# Patient Record
Sex: Male | Born: 1985 | Race: White | Hispanic: No | Marital: Single | Smoking: Current every day smoker
Health system: Southern US, Community
[De-identification: ages and names within clinical notes are randomized; demographics above are authoritative.]

## PROBLEM LIST (undated history)

## (undated) DIAGNOSIS — J45909 Unspecified asthma, uncomplicated: Secondary | ICD-10-CM

## (undated) DIAGNOSIS — M1990 Unspecified osteoarthritis, unspecified site: Secondary | ICD-10-CM

---

## 2008-07-09 ENCOUNTER — Ambulatory Visit: Payer: Self-pay | Admitting: Family Medicine

## 2009-11-02 ENCOUNTER — Emergency Department: Payer: Self-pay | Admitting: Emergency Medicine

## 2010-02-07 ENCOUNTER — Emergency Department: Payer: Self-pay | Admitting: Emergency Medicine

## 2010-10-22 ENCOUNTER — Emergency Department: Payer: Self-pay | Admitting: Unknown Physician Specialty

## 2011-01-14 ENCOUNTER — Emergency Department: Payer: Self-pay | Admitting: Emergency Medicine

## 2012-02-24 ENCOUNTER — Emergency Department: Payer: Self-pay | Admitting: Emergency Medicine

## 2012-02-24 LAB — CBC WITH DIFFERENTIAL/PLATELET
Basophil #: 0 10*3/uL (ref 0.0–0.1)
Basophil %: 0.5 %
Eosinophil #: 0.2 10*3/uL (ref 0.0–0.7)
Lymphocyte #: 1.5 10*3/uL (ref 1.0–3.6)
Lymphocyte %: 20.7 %
MCH: 32.4 pg (ref 26.0–34.0)
MCHC: 33.9 g/dL (ref 32.0–36.0)
Monocyte #: 0.8 10*3/uL — ABNORMAL HIGH (ref 0.0–0.7)
Neutrophil %: 65.9 %
Platelet: 117 10*3/uL — ABNORMAL LOW (ref 150–440)
RDW: 13.6 % (ref 11.5–14.5)

## 2012-02-24 LAB — URINALYSIS, COMPLETE
Bacteria: NONE SEEN
Blood: NEGATIVE
Glucose,UR: NEGATIVE mg/dL (ref 0–75)
Leukocyte Esterase: NEGATIVE
Nitrite: NEGATIVE
Ph: 6 (ref 4.5–8.0)
RBC,UR: NONE SEEN /HPF (ref 0–5)

## 2012-02-24 LAB — COMPREHENSIVE METABOLIC PANEL
Alkaline Phosphatase: 72 U/L (ref 50–136)
Anion Gap: 11 (ref 7–16)
BUN: 5 mg/dL — ABNORMAL LOW (ref 7–18)
Bilirubin,Total: 0.3 mg/dL (ref 0.2–1.0)
Co2: 28 mmol/L (ref 21–32)
Creatinine: 0.73 mg/dL (ref 0.60–1.30)
EGFR (African American): >60
EGFR (Non-African Amer.): >60
Osmolality: 285 (ref 275–301)
SGOT(AST): 17 U/L (ref 15–37)
SGPT (ALT): 18 U/L
Sodium: 145 mmol/L (ref 136–145)
Total Protein: 7.1 g/dL (ref 6.4–8.2)

## 2013-06-19 IMAGING — CT CT ABD-PELV W/ CM
1 of 2 series · 15 of 32 positions shown, 19 images · non-contrast
Comparison: none

REASON FOR EXAM: (1) right groin pain evaluate for incarcerated hernia;
(2) right groin pain eval
COMMENTS:   May transport without cardiac monitor

[Series 2: appendicitis · axial · 0.64mm/px · z∈[-608,-185]mm · 15 of 155 slices shown, 19 images]
[im 7/155  soft-tissue]
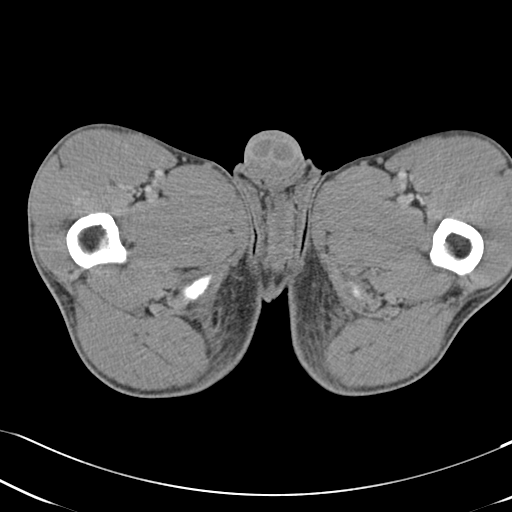
[im 7/155  bone]
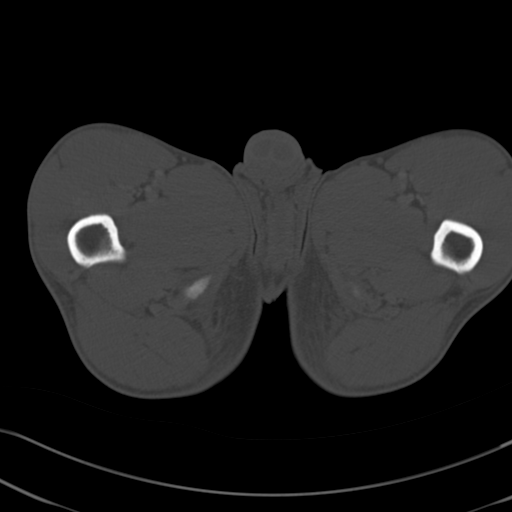
[im 19/155  soft-tissue]
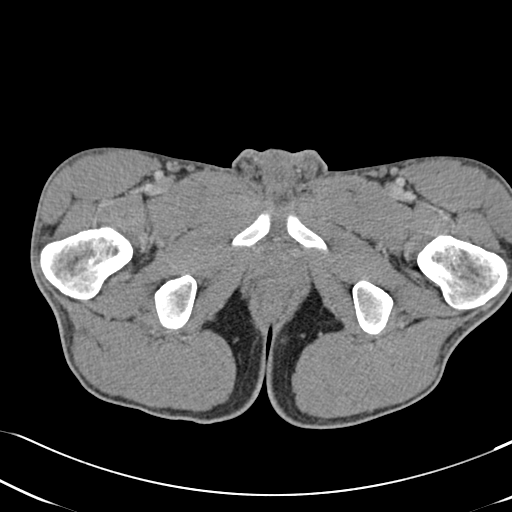
[im 31/155  soft-tissue]
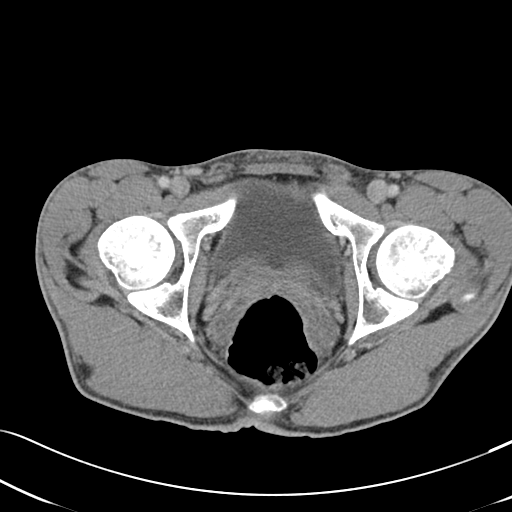
[im 44/155  soft-tissue]
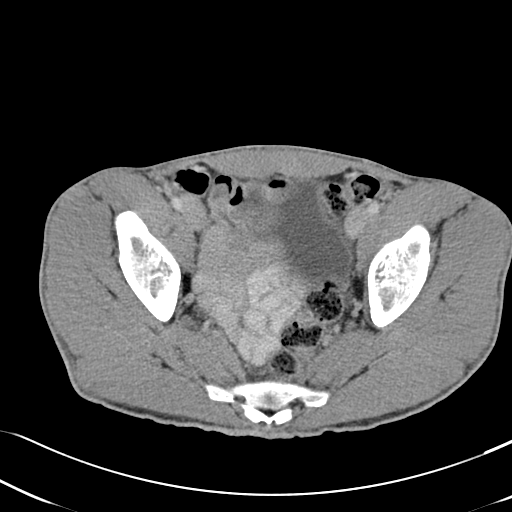
[im 56/155  soft-tissue]
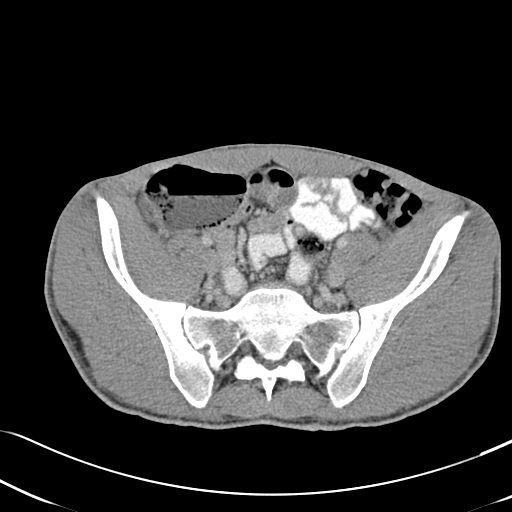
[im 68/155  soft-tissue]
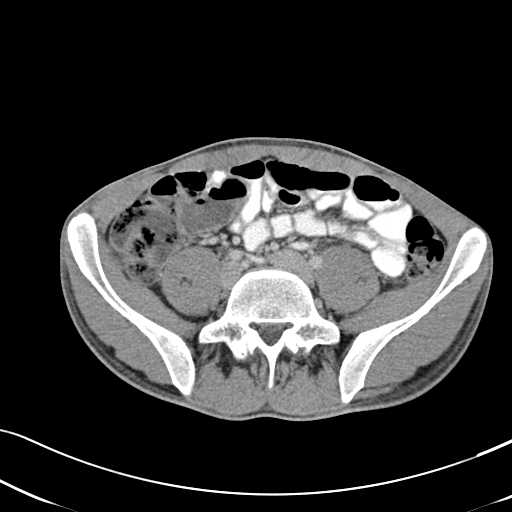
[im 81/155  soft-tissue]
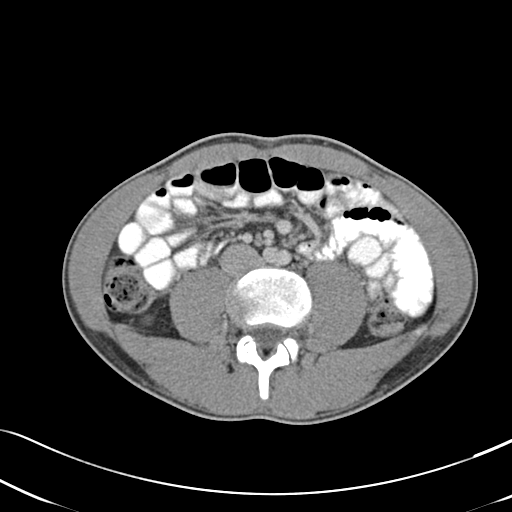
[im 87/155  soft-tissue]
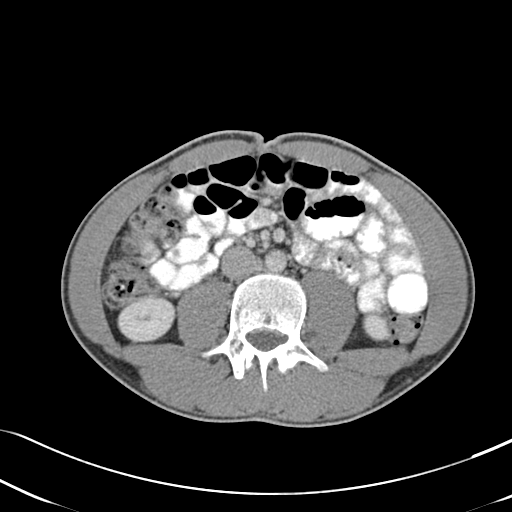
[im 99/155  soft-tissue]
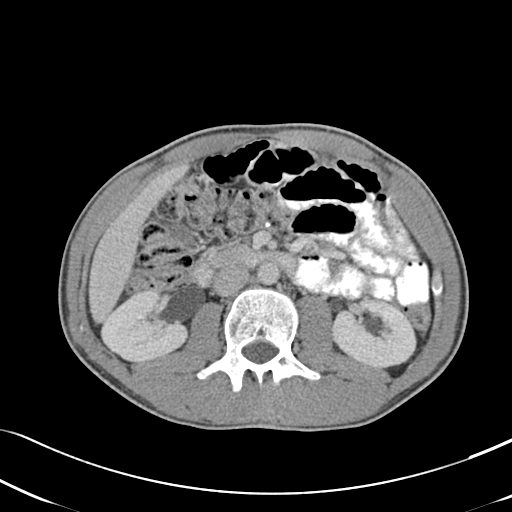
[im 99/155  bone]
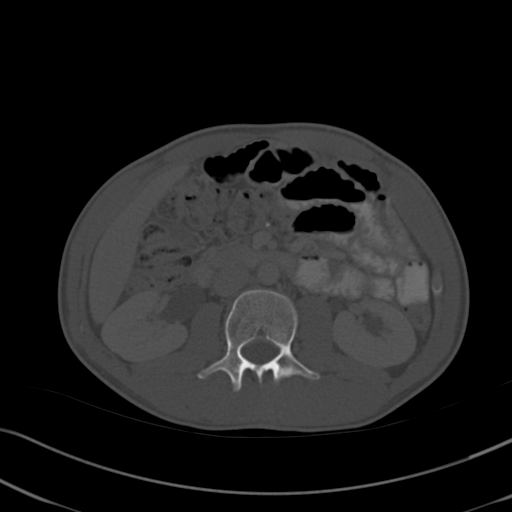
[im 111/155  soft-tissue]
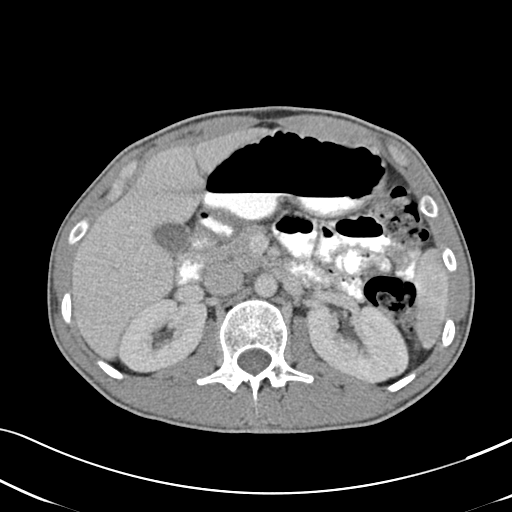
[im 124/155  soft-tissue]
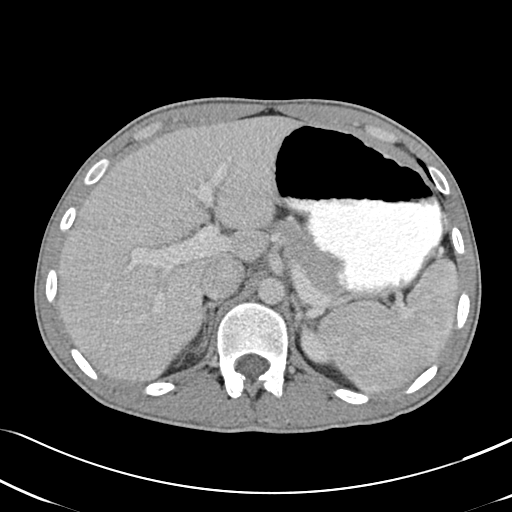
[im 130/155  lung]
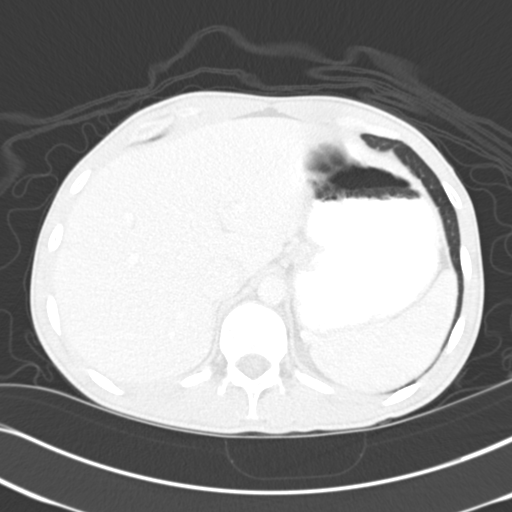
[im 136/155  soft-tissue]
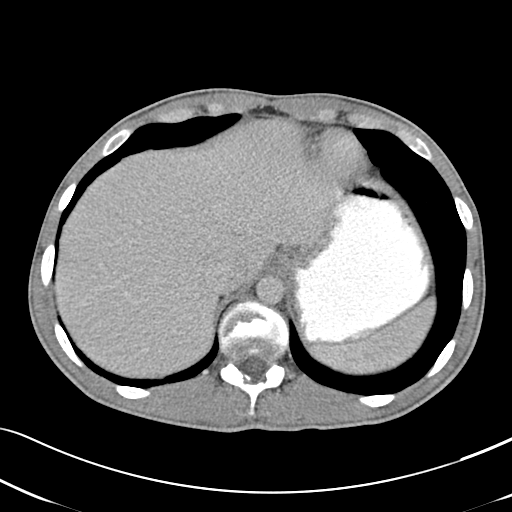
[im 136/155  lung]
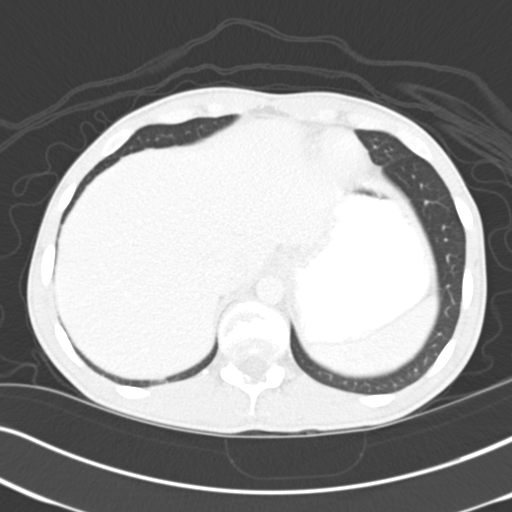
[im 142/155  lung]
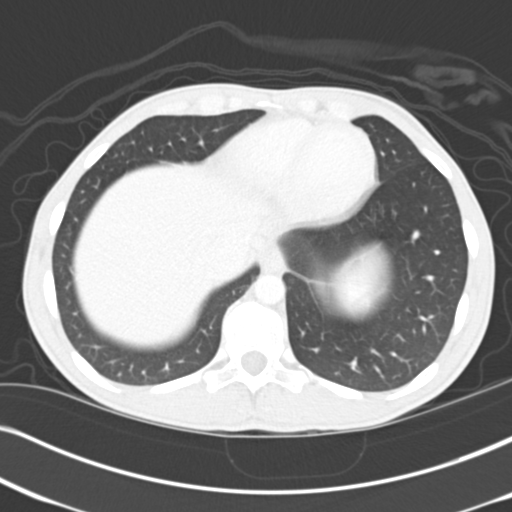
[im 148/155  soft-tissue]
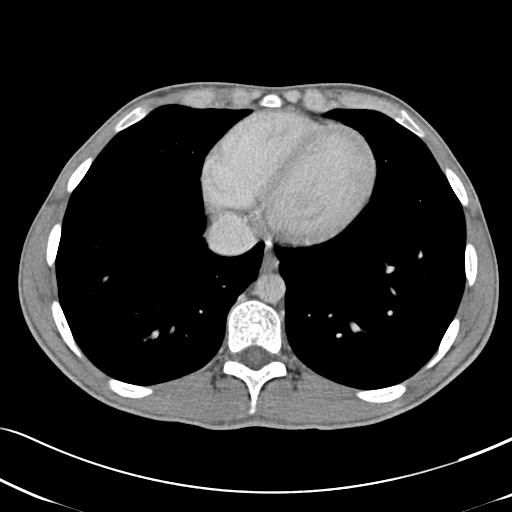
[im 148/155  lung]
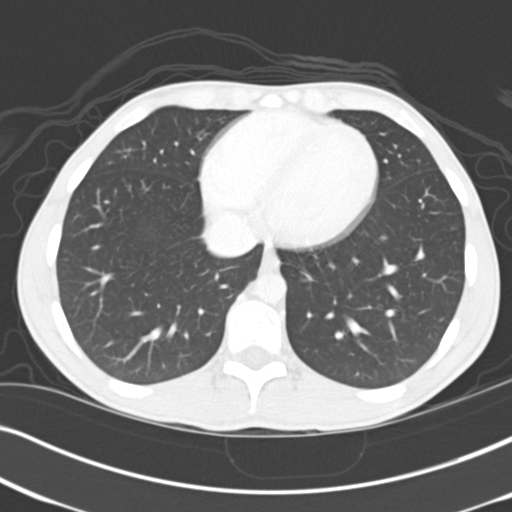

[15 of 32 positions shown; findings below may reference images not displayed]

PROCEDURE:     CT  - CT ABDOMEN / PELVIS  W  - February 24, 2012  [DATE]

RESULT:     CT of the abdomen and pelvis is performed with 100 mL of
Xsovue-RJJ iodinated intravenous contrast with images reconstructed at
mm slice thickness in the axial plane. oral contrast was also utilized. The
patient has no previous exam for comparison.

The contrast column has not reached the cecum and terminal ileum at the time
of imaging area there is a normal appearing appendix noted in the right
lower quadrant. The urinary bladder is distended. No abnormal bowel
distention is appreciated. No definite hydronephrosis or hydroureter is
appreciated. There is no evidence of an inguinal hernia. The abdominal wall
appears intact. The liver, gallbladder, pancreas, spleen, kidneys and
adrenal glands appear to be grossly normal. No gallstones are evident. The
aorta is normal in caliber. The bony structures are unremarkable.
IMPRESSION: 1. No evidence of an incarcerated inguinal hernia or other abdominal wall
defect. Normal-appearing appendix. No acute inflammatory process.

## 2014-12-15 ENCOUNTER — Emergency Department: Payer: Self-pay | Admitting: Emergency Medicine

## 2016-07-14 ENCOUNTER — Emergency Department: Payer: Self-pay

## 2016-07-14 ENCOUNTER — Emergency Department
Admission: EM | Admit: 2016-07-14 | Discharge: 2016-07-14 | Disposition: A | Discharge status: Home / Self Care | Payer: Self-pay | Attending: Emergency Medicine | Admitting: Emergency Medicine

## 2016-07-14 ENCOUNTER — Encounter: Payer: Self-pay | Admitting: *Deleted

## 2016-07-14 DIAGNOSIS — G8929 Other chronic pain: Secondary | ICD-10-CM | POA: Insufficient documentation

## 2016-07-14 DIAGNOSIS — R0789 Other chest pain: Secondary | ICD-10-CM

## 2016-07-14 DIAGNOSIS — F17210 Nicotine dependence, cigarettes, uncomplicated: Secondary | ICD-10-CM | POA: Insufficient documentation

## 2016-07-14 DIAGNOSIS — R072 Precordial pain: Secondary | ICD-10-CM | POA: Insufficient documentation

## 2016-07-14 DIAGNOSIS — J45909 Unspecified asthma, uncomplicated: Secondary | ICD-10-CM | POA: Insufficient documentation

## 2016-07-14 DIAGNOSIS — M25551 Pain in right hip: Secondary | ICD-10-CM | POA: Insufficient documentation

## 2016-07-14 DIAGNOSIS — R071 Chest pain on breathing: Secondary | ICD-10-CM

## 2016-07-14 HISTORY — DX: Unspecified osteoarthritis, unspecified site: M19.90

## 2016-07-14 HISTORY — DX: Unspecified asthma, uncomplicated: J45.909

## 2016-07-14 LAB — CBC
HEMATOCRIT: 46.4 % (ref 40.0–52.0)
Hemoglobin: 16 g/dL (ref 13.0–18.0)
MCH: 32.3 pg (ref 26.0–34.0)
MCHC: 34.5 g/dL (ref 32.0–36.0)
MCV: 93.7 fL (ref 80.0–100.0)
Platelets: 136 10*3/uL — ABNORMAL LOW (ref 150–440)
RBC: 4.96 MIL/uL (ref 4.40–5.90)
RDW: 13.2 % (ref 11.5–14.5)
WBC: 8.3 10*3/uL (ref 3.8–10.6)

## 2016-07-14 LAB — BASIC METABOLIC PANEL
Anion gap: 6 (ref 5–15)
BUN: 10 mg/dL (ref 6–20)
CHLORIDE: 103 mmol/L (ref 101–111)
CO2: 30 mmol/L (ref 22–32)
Calcium: 9.3 mg/dL (ref 8.9–10.3)
Creatinine, Ser: 0.97 mg/dL (ref 0.61–1.24)
GFR calc Af Amer: >60 mL/min (ref >60)
GFR calc non Af Amer: >60 mL/min (ref >60)
Glucose, Bld: 92 mg/dL (ref 65–99)
POTASSIUM: 4.1 mmol/L (ref 3.5–5.1)
SODIUM: 139 mmol/L (ref 135–145)

## 2016-07-14 LAB — TROPONIN I

## 2016-07-14 NOTE — ED Notes (Signed)
Pt presents w/ c/o R sided chest pain, reproducible w/ palpation and arm movement. Pt states pain started when he was cleaning a cutting board at home and pressing against the board with his hand caused his pain to increase. Pt in no respiratory distress at this time and rates his chest pain low at this time. 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 Pt does not have PCP secondary to lack of insurance. Pt denies accompanying sxs w/ chest pain. Pt c/o tenderness w/ palpation. Denies lung disease and has clear breath sounds bilaterally. Pt denies n/v at this time. Pt denies fever at this time.

## 2016-07-14 NOTE — ED Provider Notes (Addendum)
[facility] Emergency Department Provider Note   ____________________________________________  Time seen: Approximately 940 PM  I have reviewed the triage vital signs and the nursing notes.   HISTORY  Chief Complaint Chest Pain   HPI Andrew Zavala is a 30 y.o. male with a history of arthritis and asthma who is presenting to the emergency department with sudden onset chest pain several hours prior to arrival. He says that he was scrubbing dishes when he had a sharp sudden pain to the right side of his sternum anteriorly on his chest. He says it is sharp when he moves his arm but at rest it is dull. Says that arm movement as well as pushing on it hurts. Denies pain with deep breathing. Denies nausea vomiting or diaphoresis.He also says that his hip on the right side has been hurting especially with movement but says he has a history of arthritis or family history of early arthritis with hip replacements with multiple family now received needing hip replacement in the 30s.   Past Medical History  Diagnosis Date  . Arthritis   . Asthma     childhood    There are no active problems to display for this patient.   History reviewed. No pertinent past surgical history.  No current outpatient prescriptions on file.  Allergies Review of patient's allergies indicates no known allergies.  History reviewed. No pertinent family history.  Social History Social History  Substance Use Topics  . Smoking status: Current Every Day Smoker    Types: Cigarettes  . Smokeless tobacco: Never Used  . Alcohol Use: Yes    Review of Systems Constitutional: No fever/chills Eyes: No visual changes. ENT: No sore throat. Cardiovascular: As above Respiratory: Denies shortness of breath. Gastrointestinal: No abdominal pain.  No nausea, no vomiting.  No diarrhea.  No constipation. Genitourinary: Negative for dysuria. Musculoskeletal: Negative for back pain. Skin:  Negative for rash. Neurological: Negative for headaches, focal weakness or numbness.  10-point ROS otherwise negative.  ____________________________________________   PHYSICAL EXAM:  VITAL SIGNS: ED Triage Vitals  Enc Vitals Group     BP 07/14/16 1959 142/78 mmHg     Pulse Rate 07/14/16 1959 71     Resp 07/14/16 1959 18     Temp 07/14/16 1959 98.5 F (36.9 C)     Temp Source 07/14/16 1959 Oral     SpO2 07/14/16 1959 97 %     Weight 07/14/16 1959 175 lb (79.379 kg)     Height 07/14/16 1959 5\' 9"  (1.753 m)     Head Cir --      Peak Flow --      Pain Score 07/14/16 1959 2     Pain Loc --      Pain Edu? --      Excl. in GC? --     Constitutional: Alert and oriented. Well appearing and in no acute distress. Eyes: Conjunctivae are normal. PERRL. EOMI. Head: Atraumatic. Nose: No congestion/rhinnorhea. Mouth/Throat: Mucous membranes are moist.  Oropharynx non-erythematous. Neck: No stridor.   Cardiovascular: Normal rate, regular rhythm. Grossly normal heart sounds.  Good peripheral circulation With intact, equal bilateral pulses to the radial pulses and the dorsalis pedis pulses. Reproducible tenderness to palpation on the right sternal border likely where the ribs meet the sternum. Respiratory: Normal respiratory effort.  No retractions. Lungs CTAB. Gastrointestinal: Soft and nontender. No distention.  No CVA tenderness. Musculoskeletal: No lower extremity tenderness nor edema.  No joint effusions. 5 out of  5 strength bilateral lower extremities with hip flexion. Normal gait with walking. No deformity on external exam to the bilateral hips. Neurologic:  Normal speech and language. No gross focal neurologic deficits are appreciated. No gait instability. Skin:  Skin is warm, dry and intact. No rash noted. Psychiatric: Mood and affect are normal. Speech and behavior are normal.  ____________________________________________   LABS (all labs ordered are listed, but only abnormal  results are displayed)  Labs Reviewed  CBC - Abnormal; Notable for the following:    Platelets 136 (*)    All other components within normal limits  BASIC METABOLIC PANEL  TROPONIN I   ____________________________________________  EKG  ED ECG REPORT I, Schaevitz,  Teena Irani, the attending physician, personally viewed and interpreted this ECG.   Date: 07/14/2016  EKG Time: 1952  Rate: 69  Rhythm: normal sinus rhythm  Axis: Normal  Intervals:none  ST&T Change: No ST segment elevation or depression. No abnormal T-wave inversion.  ____________________________________________  RADIOLOGY      DG Chest 2 View (Final result) Result time: 07/14/16 21:43:30   Final result by Rad Results In Interface (07/14/16 21:43:30)   Narrative:   CLINICAL DATA: Chest pain  EXAM: CHEST 2 VIEW  COMPARISON: None.  FINDINGS: The heart size and mediastinal contours are within normal limits. Both lungs are clear. The visualized skeletal structures are unremarkable.  IMPRESSION: No active cardiopulmonary disease.   Electronically Signed By: Gerome Sam III M.D On: 07/14/2016 21:43    ____________________________________________   PROCEDURES  Procedures   ____________________________________________   INITIAL IMPRESSION / ASSESSMENT AND PLAN / ED COURSE  Pertinent labs & imaging results that were available during my care of the patient were reviewed by me and considered in my medical decision making (see chart for details).  Exam is consistent with costochondritis. Very reassuring workup otherwise. We'll give contact information for [facility] orthopedics. We'll also give follow-up with the [facility] clinic. PERC negative.  ____________________________________________   FINAL CLINICAL IMPRESSION(S) / ED DIAGNOSES  Costochondritis. Chronic right hip pain.    NEW MEDICATIONS STARTED DURING THIS VISIT:  New Prescriptions   No medications on file     Note:  This  document was prepared using Dragon voice recognition software and may include unintentional dictation errors.    Myrna Blazer, MD 07/14/16 2159  Discussed further treatment with the patient including ibuprofen, muscle cream such as BenGay as well as ice. He is understanding of the plan and willing to comply.  Myrna Blazer, MD 07/14/16 2202

## 2017-11-07 IMAGING — CR DG CHEST 2V
2 series · 2 of 2 positions shown · non-contrast
Comparison: None.

CLINICAL DATA: Chest pain

EXAM:
CHEST  2 VIEW

[chest pa]
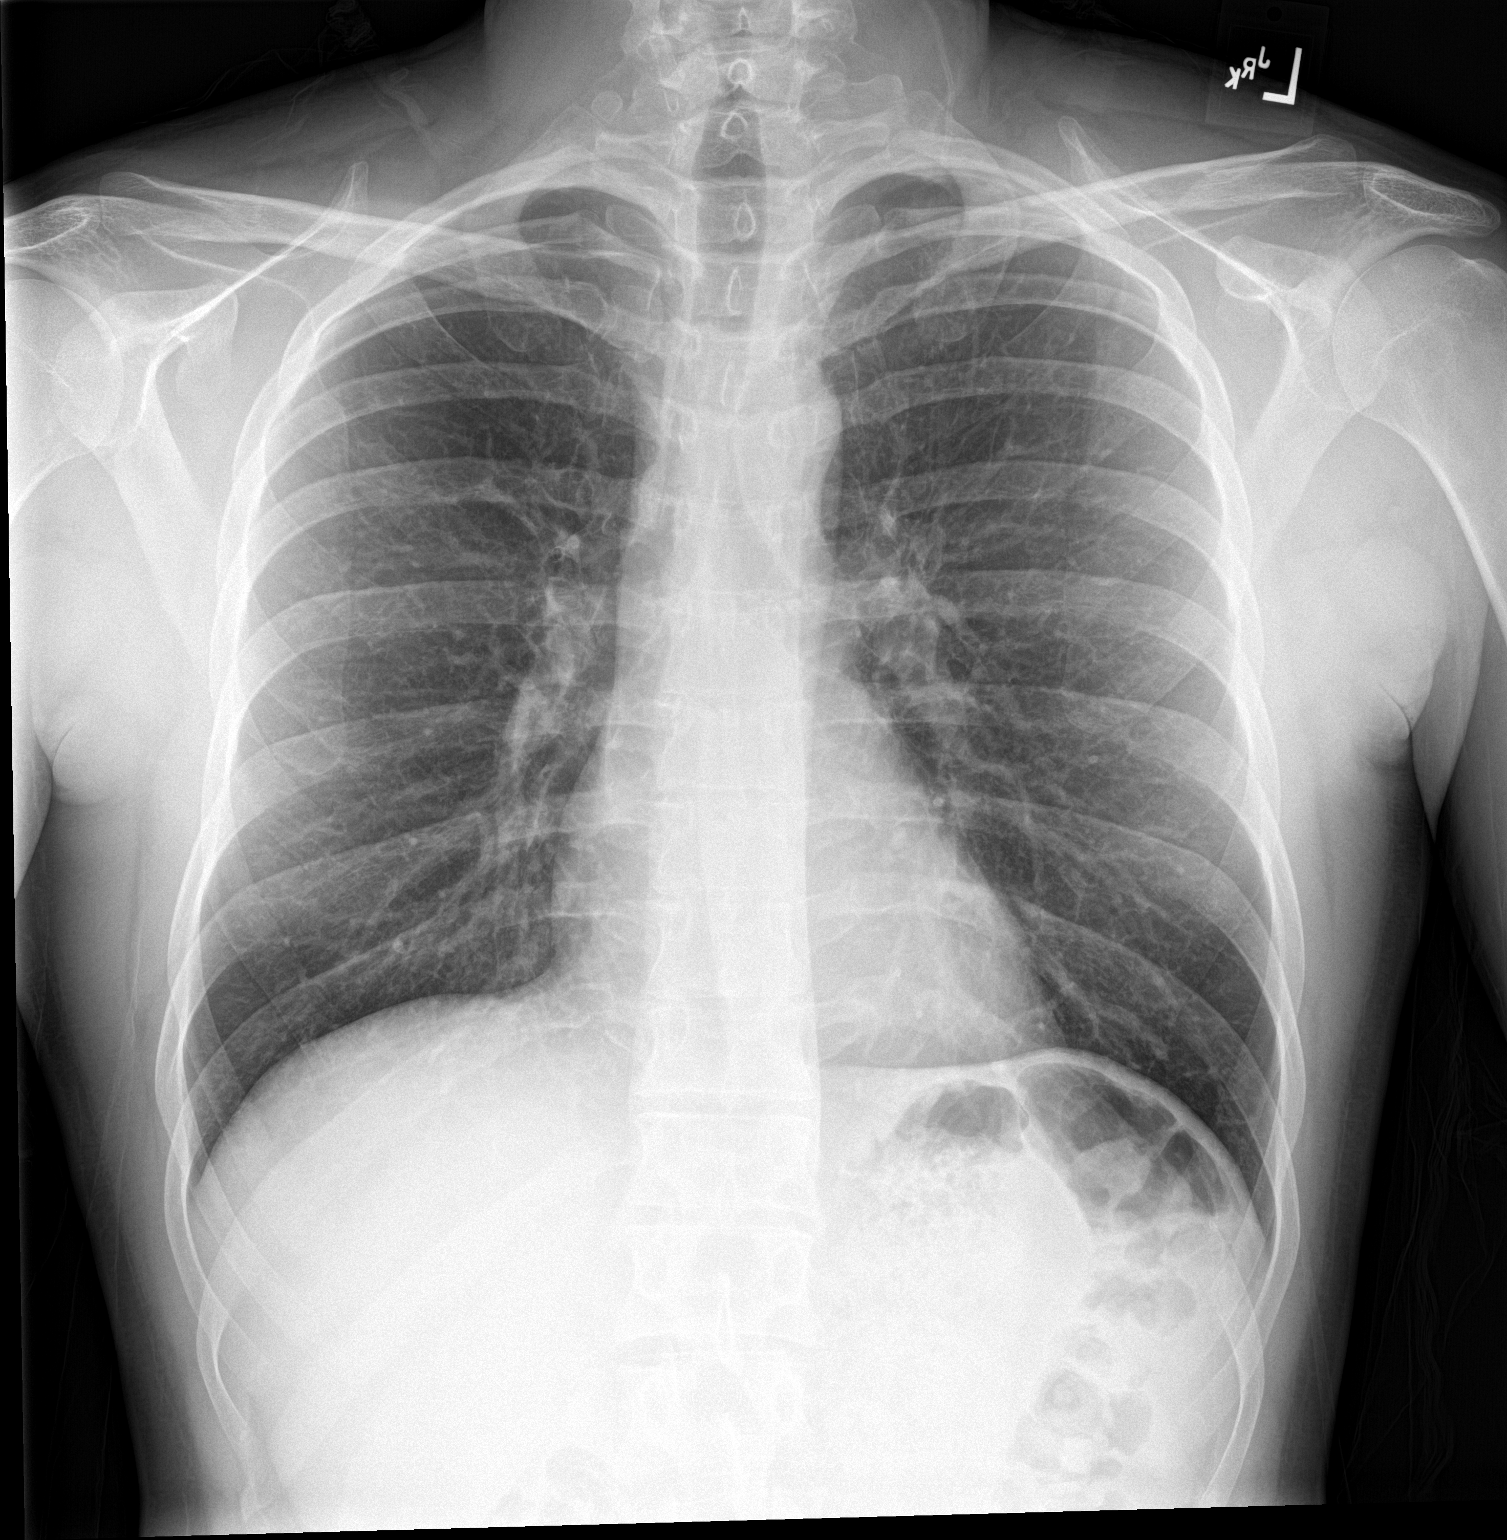

[chest lat]
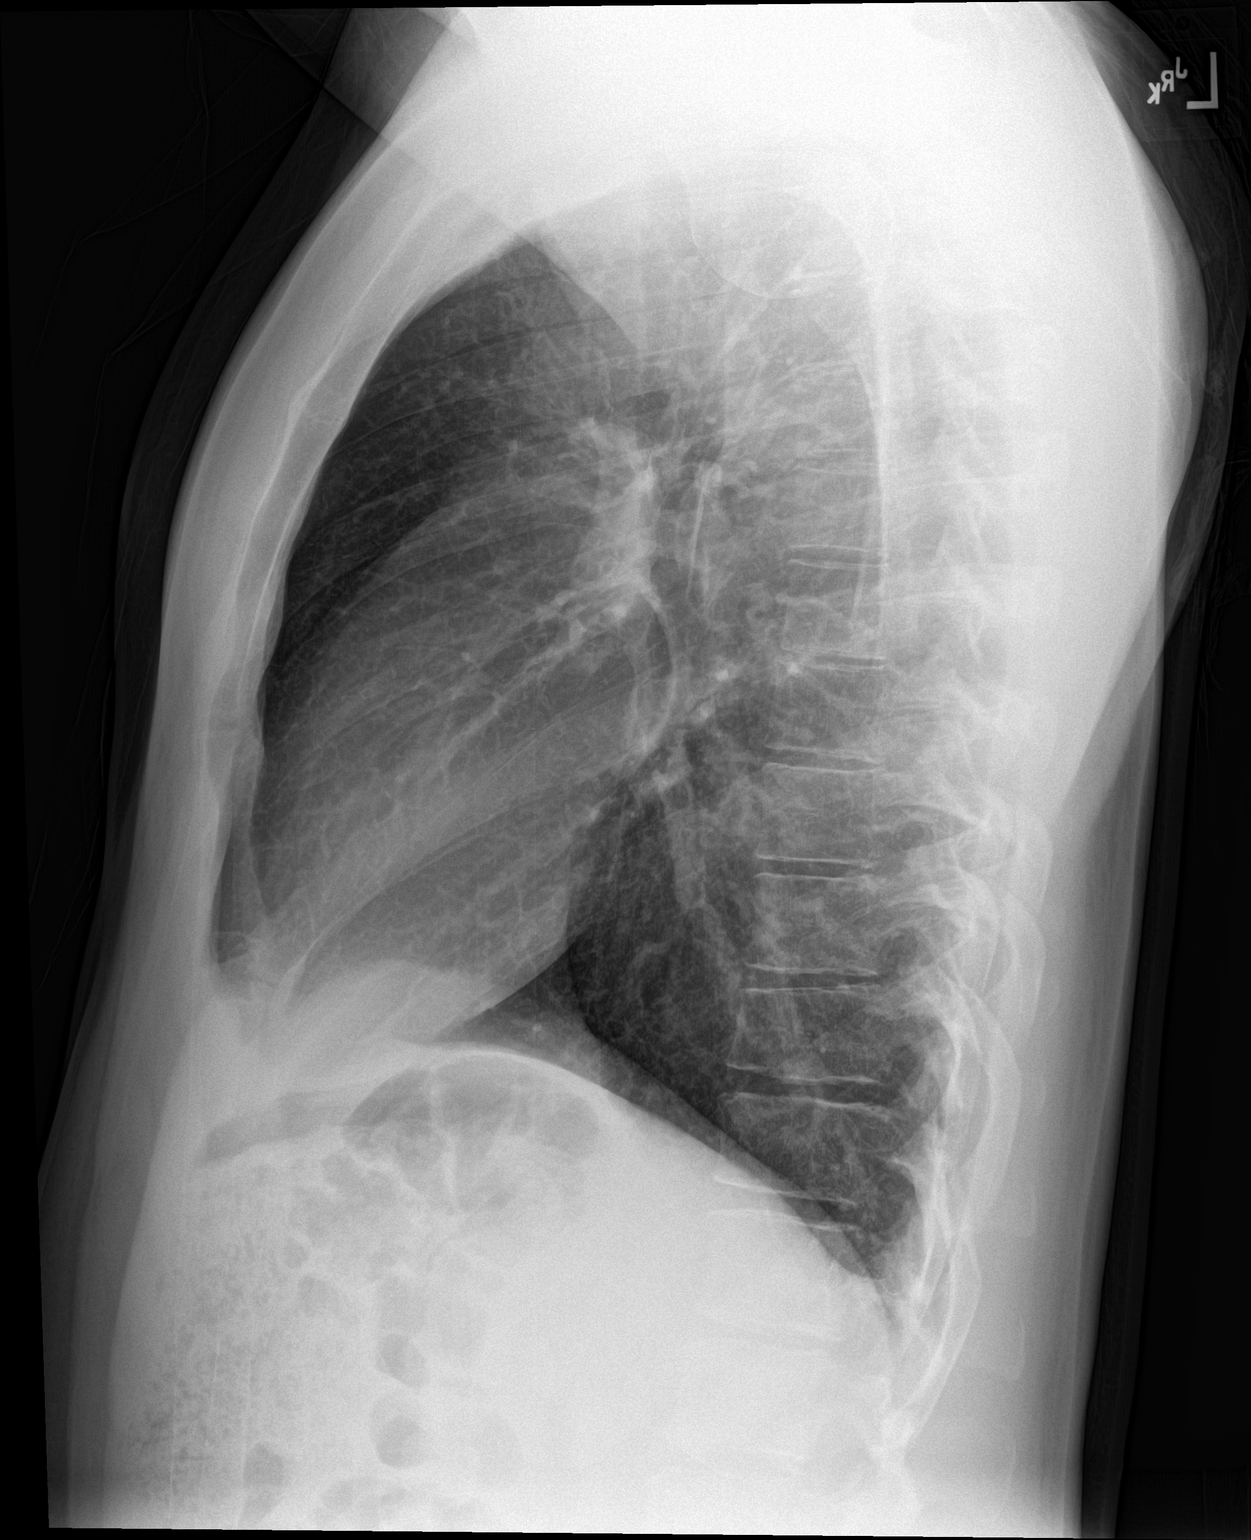

[2 of 2 positions shown; findings below may reference images not displayed]

FINDINGS: The heart size and mediastinal contours are within normal limits.
Both lungs are clear. The visualized skeletal structures are
unremarkable.
IMPRESSION: No active cardiopulmonary disease.
